# Patient Record
Sex: Female | Born: 2000 | Race: White | Hispanic: No | Marital: Single | Smoking: Never smoker
Health system: Southern US, Community
[De-identification: ages and names within clinical notes are randomized; demographics above are authoritative.]

## PROBLEM LIST (undated history)

## (undated) DIAGNOSIS — N939 Abnormal uterine and vaginal bleeding, unspecified: Secondary | ICD-10-CM

## (undated) DIAGNOSIS — H6520 Chronic serous otitis media, unspecified ear: Secondary | ICD-10-CM

## (undated) DIAGNOSIS — J36 Peritonsillar abscess: Secondary | ICD-10-CM

## (undated) DIAGNOSIS — T7840XA Allergy, unspecified, initial encounter: Secondary | ICD-10-CM

## (undated) DIAGNOSIS — G43909 Migraine, unspecified, not intractable, without status migrainosus: Secondary | ICD-10-CM

## (undated) HISTORY — DX: Abnormal uterine and vaginal bleeding, unspecified: N93.9

## (undated) HISTORY — DX: Migraine, unspecified, not intractable, without status migrainosus: G43.909

## (undated) HISTORY — DX: Peritonsillar abscess: J36

## (undated) HISTORY — DX: Chronic serous otitis media, unspecified ear: H65.20

## (undated) HISTORY — DX: Allergy, unspecified, initial encounter: T78.40XA

---
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
## 2021-06-20 HISTORY — PX: WISDOM TOOTH EXTRACTION: SHX21

## 2022-05-26 ENCOUNTER — Ambulatory Visit: Payer: 59 | Admitting: Radiology

## 2022-06-26 ENCOUNTER — Ambulatory Visit (INDEPENDENT_AMBULATORY_CARE_PROVIDER_SITE_OTHER): Payer: 59 | Admitting: Nurse Practitioner

## 2022-06-26 ENCOUNTER — Encounter: Payer: Self-pay | Admitting: Nurse Practitioner

## 2022-06-26 VITALS — BP 126/74 | HR 104 | Ht 65.5 in | Wt 120.0 lb

## 2022-06-26 DIAGNOSIS — N921 Excessive and frequent menstruation with irregular cycle: Secondary | ICD-10-CM

## 2022-06-26 DIAGNOSIS — Z30016 Encounter for initial prescription of transdermal patch hormonal contraceptive device: Secondary | ICD-10-CM

## 2022-06-26 DIAGNOSIS — Z01419 Encounter for gynecological examination (general) (routine) without abnormal findings: Secondary | ICD-10-CM | POA: Diagnosis not present

## 2022-06-26 DIAGNOSIS — Z789 Other specified health status: Secondary | ICD-10-CM

## 2022-06-26 DIAGNOSIS — Z113 Encounter for screening for infections with a predominantly sexual mode of transmission: Secondary | ICD-10-CM | POA: Diagnosis not present

## 2022-06-26 DIAGNOSIS — N920 Excessive and frequent menstruation with regular cycle: Secondary | ICD-10-CM

## 2022-06-26 LAB — PREGNANCY, URINE: Preg Test, Ur: NEGATIVE

## 2022-06-26 MED ORDER — NORELGESTROMIN-ETH ESTRADIOL 150-35 MCG/24HR TD PTWK: 1.0000 | MEDICATED_PATCH | TRANSDERMAL | 3 refills | Status: DC

## 2022-06-26 NOTE — Progress Notes (Signed)
Leah Gallegos 07-Apr-2000 [id number]   History:  22 y.o. G0 presents for annual exam. H/O menorrhagia. 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 Was on Seasonale for a few years but hen began having BTB so was switched to Junel. Feels bleeding is worse. If she is 1-3 hours late on taking pill she bleeds and it is hard to get back under control. Would like STD screening today. Condom got stuck in vagina after intercourse. Would like UPT today.   Gynecologic History Patient's last menstrual period was 04/30/2022.   Contraception/Family planning: OCP (estrogen/progesterone) Sexually active: Yes  Health Maintenance Last Pap: Not indicated  Last mammogram: Not indicated  Last colonoscopy: Not indicated  Last Dexa: Not indicated    Past medical history, past surgical history, family history and social history were all reviewed and documented in the EPIC chart. 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   ROS:  A ROS was performed and pertinent positives and negatives are included.  Exam:  Vitals:   06/26/22 1321  BP: 126/74  Pulse: (!) 104  SpO2: 99%  Weight: 120 lb (54.4 kg)  Height: 5' 5.5" (1.664 m)   Body mass index is 19.67 kg/m.  General appearance:  Normal Thyroid:  Symmetrical, normal in size, without palpable masses or nodularity. Respiratory  Auscultation:  Clear without wheezing or rhonchi Cardiovascular  Auscultation:  Regular rate, without rubs, murmurs or gallops  Edema/varicosities:  Not grossly evident Abdominal  Soft,nontender, without masses, guarding or rebound.  Liver/spleen:  No organomegaly noted  Hernia:  None appreciated  Skin  Inspection:  Grossly normal Breasts: Not indicated per guidelines Genitourinary   Inguinal/mons:  Normal without inguinal adenopathy  External genitalia:  Normal appearing vulva with no masses, tenderness, or lesions  BUS/Urethra/Skene's glands:  Normal  Vagina:  Normal appearing with normal color  and discharge, no lesions  Cervix:  Normal appearing without discharge or lesions  Uterus:  Normal in size, shape and contour.  Midline and mobile, nontender  Adnexa/parametria:     Rt: Normal in size, without masses or tenderness.   Lt: Normal in size, without masses or tenderness.  Anus and perineum: Normal  Digital rectal exam: Deferred  Patient informed chaperone available to be present for breast and pelvic exam. Patient has requested no chaperone to be present. Patient has been advised what will be completed during breast and pelvic exam.   UPT negative  Assessment/Plan:  22 y.o. G0 for annual exam and BTB bleeding.   Well female exam with routine gynecological exam - Education provided on SBEs, importance of preventative screenings, current guidelines, high calcium diet, regular exercise, and multivitamin daily.   Screening examination for STD (sexually transmitted disease) - Plan: C. trachomatis/N. gonorrhoeae RNA. Declines HIV/RPR.   Breakthrough bleeding on birth control pills - Plan: norelgestromin-ethinyl estradiol [employer]) 150-35 MCG/24HR transdermal patch weekly. Use continuously with withdrawal bleed every 3 months. Has tried a couple of different pills and experiences BTB bleeding. Discussed switching pill or switching to different contraceptive method. She would like to try patch. Educated on proper use. Will finish out current pack and start with next menses in a couple of weeks.   Problem with condom - Plan: Pregnancy, urine. Negative UPT.   Menorrhagia with regular cycle - Plan: norelgestromin-ethinyl estradiol [employer]) 150-35 MCG/24HR transdermal patch weekly.   Encounter for initial prescription of transdermal patch hormonal contraceptive device - Plan: norelgestromin-ethinyl estradiol [employer]) 150-35 MCG/24HR transdermal patch weekly.   Return in 1  year for annual or sooner if needed.     Olivia Mackie DNP, 2:04 PM 06/26/2022

## 2022-06-27 LAB — C. TRACHOMATIS/N. GONORRHOEAE RNA
C. trachomatis RNA, TMA: NOT DETECTED
N. gonorrhoeae RNA, TMA: NOT DETECTED

## 2022-11-22 ENCOUNTER — Telehealth: Payer: Self-pay

## 2022-11-22 NOTE — Telephone Encounter (Signed)
Let's run a vaginal ultrasound to evaluate further and follow in clinic to discuss results.  Return sooner if the pain is worsening or severe. Dr. Karma Greaser

## 2022-11-22 NOTE — Telephone Encounter (Signed)
TW pt LVM in triage line stating she would like to report sxs of ongoing pelvic pressure feeling as if enlargement in ovaries and had a "bursting" sensation felt last night. Wants to know if related to [facility] or not.   Spoke w/ pt and she reported that the pressure had been noticeable for ~2 weeks now, seemed to be around the time she received her last refill of her patches and they dispensed her the generic versus the brand that she had been receiving.   Pt denies hx of ovarian cysts. Reports no cycle in the last month-skips them with the [facility]. Was SA ~1 month ago but not since.   Pt states the episode that happened in the middle of the night woke her up with sharp pain in LLQ and she couldn't go back to sleep until after taking some ibuprofen.  States today some discomfort is still present intermittently but bearable. Compares it to menstrual like cramping in LLQ and into center of pelvis.  Please advise.

## 2022-11-23 NOTE — Telephone Encounter (Signed)
Spoke with patient. Advised per Dr. Karma Greaser.  Patient is currently away at college in [place], is unsure when she will be returning home.   Patient reports she just woke up, pain is not present in the mornings, increases at night. Denies any other GYN symptoms, fever/chills, N/V. Reviewed birth control, patient is going to check with pharmacy to determine if she can switch back to the brand/manufacture that she was taking previously.   Reviewed options for local evaluation, such as urgent care, ER or student health on campus if symptoms worsen, symptoms continue or new symptoms develop. Advised I will provide update to Dr. Karma Greaser and return call if any additional recommendations. Patient agreeable.   Routing to provider for final review. Patient is agreeable to disposition. Will close encounter.

## 2023-03-21 ENCOUNTER — Ambulatory Visit: Payer: Self-pay | Admitting: Family Medicine

## 2023-03-21 NOTE — Telephone Encounter (Signed)
Pt has Tamiflu and has no concerns

## 2023-03-21 NOTE — Telephone Encounter (Signed)
Copied from CRM [id number]. Topic: Clinical - Pink Word Triage >> Mar 21, 2023  8:56 AM Kathryne Eriksson wrote: Reason for Triage: Influenza A >> Mar 21, 2023  8:59 AM Kathryne Eriksson wrote: Patient's Father Called In, Stating That The Patient Tested Positive For Influenza A And Is Requesting A Prescription For Tamiflu. Patient's Father "Everlean Alstrom" Is Requesting A Call Back At [phone].  The patient was able to schedule telehealth appointment and was prescribed Tamiflu.  No further assistance was needed.  No triage required.   Reason for Disposition  Caller has already spoken with the PCP and has no further questions.  Protocols used: No Contact or Duplicate Contact Call-A-AH

## 2023-05-24 ENCOUNTER — Telehealth: Payer: Self-pay | Admitting: *Deleted

## 2023-05-24 NOTE — Telephone Encounter (Signed)
 Do not recommend continually replacing patch before it is due since she will not have enough patches. Need to consider alternative. Should not be an issue with the patch changed a couple of days after intercourse if patch had not fallen off and was replaced right away.

## 2023-05-24 NOTE — Telephone Encounter (Signed)
 Spoke with patient. Patient has been on Xulane patch since August. Uses continuously for 3 months and allows for menses.   Has been experiencing issues with the patch coming off, has tried waterproof bandages to keep in place. Applying on buttocks.   Placed a new patch on Sunday, sexually active on Monday, patch started coming off on Wednesday, replaced patch.   Discussed alternative sites such as upper outer arm, abdomen and back. Keeping skin free of oils and lotions prior to applying. May want to consider alternative options if she continues to have issues with patch sticking.    Patient asking if ok to replace patch when this occurs?  Any concerns regarding pregnancy, intercourse on Monday, patch change Wednesday?

## 2023-05-24 NOTE — Telephone Encounter (Signed)
 Spoke with patient. Advised per Tiffany. Patient is going to try another location on her body, if she continues to experience issues she will will consider changing to another option.   Patient verbalizes understanding and is agreeable.   Encounter closed.

## 2023-09-07 ENCOUNTER — Encounter: Payer: Self-pay | Admitting: Advanced Practice Midwife

## 2023-09-14 ENCOUNTER — Other Ambulatory Visit: Payer: Self-pay | Admitting: *Deleted

## 2023-09-14 DIAGNOSIS — Z30016 Encounter for initial prescription of transdermal patch hormonal contraceptive device: Secondary | ICD-10-CM

## 2023-09-14 DIAGNOSIS — N921 Excessive and frequent menstruation with irregular cycle: Secondary | ICD-10-CM

## 2023-09-14 DIAGNOSIS — N920 Excessive and frequent menstruation with regular cycle: Secondary | ICD-10-CM

## 2023-09-14 MED ORDER — NORELGESTROMIN-ETH ESTRADIOL 150-35 MCG/24HR TD PTWK
1.0000 | MEDICATED_PATCH | TRANSDERMAL | 0 refills | Status: DC
Start: 2023-09-14 — End: 2023-11-30

## 2023-09-14 NOTE — Telephone Encounter (Signed)
 Med refill request: xulane patch 150-35 mcg/24hr Last AEX: 06/26/22 -TW Next AEX: 11/30/23 -TW Last MMG (if hormonal med) N/A  Call returned to patient to confirm pharmacy, pharmacy updated.   Refill authorized: Please Advise?

## 2023-11-30 ENCOUNTER — Encounter: Admitting: Nurse Practitioner

## 2023-11-30 ENCOUNTER — Other Ambulatory Visit (HOSPITAL_COMMUNITY)
Admission: RE | Admit: 2023-11-30 | Discharge: 2023-11-30 | Disposition: A | Discharge status: Home / Self Care | Source: Ambulatory Visit | Attending: Nurse Practitioner | Admitting: Nurse Practitioner

## 2023-11-30 ENCOUNTER — Ambulatory Visit (INDEPENDENT_AMBULATORY_CARE_PROVIDER_SITE_OTHER): Admitting: Nurse Practitioner

## 2023-11-30 ENCOUNTER — Encounter: Payer: Self-pay | Admitting: Nurse Practitioner

## 2023-11-30 VITALS — BP 102/60 | HR 107 | Ht 64.37 in | Wt 126.6 lb

## 2023-11-30 DIAGNOSIS — Z01419 Encounter for gynecological examination (general) (routine) without abnormal findings: Secondary | ICD-10-CM | POA: Diagnosis present

## 2023-11-30 DIAGNOSIS — N920 Excessive and frequent menstruation with regular cycle: Secondary | ICD-10-CM | POA: Diagnosis not present

## 2023-11-30 DIAGNOSIS — Z30015 Encounter for initial prescription of vaginal ring hormonal contraceptive: Secondary | ICD-10-CM

## 2023-11-30 DIAGNOSIS — Z124 Encounter for screening for malignant neoplasm of cervix: Secondary | ICD-10-CM | POA: Diagnosis present

## 2023-11-30 DIAGNOSIS — Z1331 Encounter for screening for depression: Secondary | ICD-10-CM

## 2023-11-30 MED ORDER — ETONOGESTREL-ETHINYL ESTRADIOL 0.12-0.015 MG/24HR VA RING
1.0000 | VAGINAL_RING | VAGINAL | 3 refills | Status: AC
Start: 2023-11-30 — End: ?

## 2023-11-30 NOTE — Progress Notes (Signed)
 Leah Gallegos 2000-12-21 [id number]   History:  23 y.o. G0 presents for annual exam. H/O menorrhagia. Has been doing patch continuously with withdrawal bleed every 3 months but does heated workouts and patch has not been staying on as well. Has tried placing water resistant bandaids on it. Tried a couple of different pills in the past but had BTB.   Gynecologic History Patient's last menstrual period was 10/02/2023 (within days).   Contraception/Family planning: OCP (estrogen/progesterone) Sexually active: No  Health Maintenance Last Pap: Never Last mammogram: Not indicated  Last colonoscopy: Not indicated  Last Dexa: Not indicated       07/23/2019    1:49 PM  Depression screen PHQ 2/9  Decreased Interest 0  Down, Depressed, Hopeless 0  PHQ - 2 Score 0  Altered sleeping 0  Tired, decreased energy 0  Change in appetite 0  Feeling bad or failure about yourself  0  Trouble concentrating 0  Moving slowly or fidgety/restless 0  Suicidal thoughts 0  PHQ-9 Score 0  Difficult doing work/chores Not difficult at all     Past medical history, past surgical history, family history and social history were all reviewed and documented in the EPIC chart. In masters program at [employer]. Has psych degree.   ROS:  A ROS was performed and pertinent positives and negatives are included.  Exam:  Vitals:   11/30/23 1051  BP: 102/60  Pulse: (!) 107  SpO2: 98%  Weight: 126 lb 9.6 oz (57.4 kg)  Height: 5' 4.37 (1.635 m)    Body mass index is 21.48 kg/m.  General appearance:  Normal Thyroid :  Symmetrical, normal in size, without palpable masses or nodularity. Respiratory  Auscultation:  Clear without wheezing or rhonchi Cardiovascular  Auscultation:  Regular rate, without rubs, murmurs or gallops  Edema/varicosities:  Not grossly evident Abdominal  Soft,nontender, without masses, guarding or rebound.  Liver/spleen:  No organomegaly noted  Hernia:  None appreciated   Skin  Inspection:  Grossly normal Breasts: Not indicated per guidelines Pelvic: External genitalia:  no lesions              Urethra:  normal appearing urethra with no masses, tenderness or lesions              Bartholins and Skenes: normal                 Vagina: normal appearing vagina with normal color and discharge, no lesions              Cervix: no lesions Bimanual Exam:  Uterus:  no masses or tenderness              Adnexa: no mass, fullness, tenderness              Rectovaginal: Deferred              Anus:  normal, no lesions  Geni Pica, CMA present as chaperone.   Assessment/Plan:  23 y.o. G0 for annual exam.  Well female exam with routine gynecological exam - Education provided on SBEs, importance of preventative screenings, current guidelines, high calcium diet, regular exercise, and multivitamin daily. Has PCP.   Menorrhagia with regular cycle - Plan: etonogestrel-ethinyl estradiol  (NUVARING) 0.12-0.015 MG/24HR vaginal ring every 28 days. Can use continuously.   Cervical cancer screening - Plan: Cytology - PAP( [facility]). Initial pap today.   Encounter for initial prescription of vaginal ring hormonal contraceptive - Plan: etonogestrel-ethinyl estradiol  (NUVARING) 0.12-0.015 MG/24HR vaginal ring every  28 days. Educated on proper use.   Contraceptive options were reviewed, including hormonal methods, both combination (pill, patch, vaginal ring) and progesterone-only (pill, Depo Provera and Nexplanon), intrauterine devices (Mirena, Kyleena, Skyla, and Paraguard), Phexxi, barrier methods (condoms, diaphragm) and female/female sterilization. The mechanisms, risks, benefits and side effects of all methods were discussed.   Return in about 1 year (around 11/29/2024) for Annual.     Leah DELENA Shutter DNP, 11:18 AM 11/30/2023

## 2023-12-04 LAB — CYTOLOGY - PAP: Diagnosis: NEGATIVE

## 2023-12-05 ENCOUNTER — Ambulatory Visit: Payer: Self-pay | Admitting: Nurse Practitioner

## 2023-12-18 ENCOUNTER — Telehealth: Payer: Self-pay

## 2023-12-18 NOTE — Telephone Encounter (Signed)
Pt has been made aware and expressed understanding

## 2023-12-18 NOTE — Telephone Encounter (Signed)
 Please reassure her that irregular bleeding is normal when changing birth controls and can take 3-6 months to regulate.

## 2023-12-18 NOTE — Telephone Encounter (Signed)
 Pt called in stating that she started the Ring on 12/01/23. She started having some spotting 2-3 days ago and this morning around 930 she said she is having a full-on period. She did say she did a work-out class this morning with a lot of moving, she doesn't know if that contributed or not. I asked how many times has she changed a pad/tampon and she states that she hasn't because it just start an hour ago. She is having some mild cramping that comes and goes. Per pt the pain scale is a level 4.   She wanted to know if this is a normal? Should she be worried? Should she take the ring out?   Please advise
